# Patient Record
Sex: Male | Born: 1995 | Race: White | Hispanic: No | Marital: Single | Smoking: Current every day smoker
Health system: Southern US, Community
[De-identification: ages and names within clinical notes are randomized; demographics above are authoritative.]

---

## 2015-08-08 ENCOUNTER — Emergency Department (HOSPITAL_COMMUNITY): Payer: Self-pay

## 2015-08-08 ENCOUNTER — Encounter (HOSPITAL_COMMUNITY): Payer: Self-pay | Admitting: Emergency Medicine

## 2015-08-08 ENCOUNTER — Emergency Department (HOSPITAL_COMMUNITY)
Admission: EM | Admit: 2015-08-08 | Discharge: 2015-08-08 | Disposition: A | Discharge status: Home / Self Care | Payer: Self-pay | Attending: Emergency Medicine | Admitting: Emergency Medicine

## 2015-08-08 DIAGNOSIS — R05 Cough: Secondary | ICD-10-CM | POA: Insufficient documentation

## 2015-08-08 DIAGNOSIS — M545 Low back pain, unspecified: Secondary | ICD-10-CM

## 2015-08-08 DIAGNOSIS — R079 Chest pain, unspecified: Secondary | ICD-10-CM

## 2015-08-08 DIAGNOSIS — Z720 Tobacco use: Secondary | ICD-10-CM | POA: Insufficient documentation

## 2015-08-08 MED ORDER — TRAMADOL HCL 50 MG PO TABS: 50.0000 mg | ORAL_TABLET | Freq: Two times a day (BID) | ORAL | Status: AC | PRN

## 2015-08-08 MED ORDER — OXYCODONE-ACETAMINOPHEN 5-325 MG PO TABS
1.0000 | ORAL_TABLET | Freq: Once | ORAL | Status: AC
Start: 2015-08-08 — End: 2015-08-08
  Administered 2015-08-08: 1 via ORAL
  Filled 2015-08-08: qty 1

## 2015-08-08 MED ORDER — METHOCARBAMOL 500 MG PO TABS: 500.0000 mg | ORAL_TABLET | Freq: Three times a day (TID) | ORAL | Status: AC | PRN

## 2015-08-08 MED ORDER — METHOCARBAMOL 500 MG PO TABS
500.0000 mg | ORAL_TABLET | Freq: Once | ORAL | Status: AC
  Administered 2015-08-08: 500 mg via ORAL
  Filled 2015-08-08: qty 1

## 2015-08-08 MED ORDER — IBUPROFEN 400 MG PO TABS: 400.0000 mg | ORAL_TABLET | Freq: Three times a day (TID) | ORAL | Status: AC
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
## 2022-06-12 NOTE — Congregational Nurse Program (Signed)
  Dept: [phone]   Congregational Nurse Program Note  Date of Encounter: 06/12/2022  Past Medical History: No past medical history on file.  Encounter Details:  CNP Questionnaire - 06/12/22 1118       Questionnaire   Do you give verbal consent to treat you today? Yes   unable to reach by phone left message   Location Patient [facility]    Visit Setting Phone/Text/Email    Patient Status Unknown    Insurance Unknown    Insurance Referral N/A    Medication N/A    Medical Provider No    Screening Referrals N/A    Medical Referral N/A    Medical Appointment Made N/A    Food N/A    Transportation N/A    Housing/Utilities N/A    Interpersonal Safety N/A    Intervention N/A    ED Visit Averted N/A    Life-Saving Intervention Made N/A            Dr Jorja Loa requested follow up call to  patient about hematuria and Hep C. Reached out to patient and left message on voice mail waiting return call.  Miklo Aken [employer] RN [employer] Cell [phone]
